# Patient Record
Sex: Male | Born: 1975 | Race: White | Hispanic: No | Marital: Married | Smoking: Never smoker
Health system: Southern US, Community
[De-identification: ages and names within clinical notes are randomized; demographics above are authoritative.]

## PROBLEM LIST (undated history)

## (undated) HISTORY — PX: PILONIDAL CYST / SINUS EXCISION: SUR543

---

## 2008-10-09 ENCOUNTER — Encounter: Admission: RE | Admit: 2008-10-09 | Discharge: 2008-10-09 | Payer: Self-pay | Admitting: Internal Medicine

## 2015-01-28 HISTORY — PX: APPENDECTOMY: SHX54

## 2019-09-02 DIAGNOSIS — Z23 Encounter for immunization: Secondary | ICD-10-CM | POA: Diagnosis not present

## 2019-09-30 DIAGNOSIS — Z23 Encounter for immunization: Secondary | ICD-10-CM | POA: Diagnosis not present

## 2020-11-03 DIAGNOSIS — I10 Essential (primary) hypertension: Secondary | ICD-10-CM | POA: Diagnosis not present

## 2020-11-17 DIAGNOSIS — I10 Essential (primary) hypertension: Secondary | ICD-10-CM | POA: Diagnosis not present

## 2020-11-22 DIAGNOSIS — I10 Essential (primary) hypertension: Secondary | ICD-10-CM | POA: Diagnosis not present

## 2020-12-18 DIAGNOSIS — W19XXXA Unspecified fall, initial encounter: Secondary | ICD-10-CM | POA: Diagnosis not present

## 2020-12-18 DIAGNOSIS — N289 Disorder of kidney and ureter, unspecified: Secondary | ICD-10-CM | POA: Diagnosis not present

## 2020-12-18 DIAGNOSIS — M545 Low back pain, unspecified: Secondary | ICD-10-CM | POA: Diagnosis not present

## 2020-12-18 DIAGNOSIS — M549 Dorsalgia, unspecified: Secondary | ICD-10-CM | POA: Diagnosis not present

## 2020-12-18 DIAGNOSIS — S22080A Wedge compression fracture of T11-T12 vertebra, initial encounter for closed fracture: Secondary | ICD-10-CM | POA: Diagnosis not present

## 2020-12-18 DIAGNOSIS — R55 Syncope and collapse: Secondary | ICD-10-CM | POA: Diagnosis not present

## 2020-12-18 DIAGNOSIS — R42 Dizziness and giddiness: Secondary | ICD-10-CM | POA: Diagnosis not present

## 2020-12-19 ENCOUNTER — Other Ambulatory Visit: Payer: Self-pay

## 2020-12-19 ENCOUNTER — Emergency Department (HOSPITAL_COMMUNITY): Payer: Medicaid Other

## 2020-12-19 ENCOUNTER — Emergency Department (HOSPITAL_COMMUNITY)
Admission: EM | Admit: 2020-12-19 | Discharge: 2020-12-19 | Disposition: A | Discharge status: Home / Self Care | Payer: Medicaid Other | Attending: Emergency Medicine | Admitting: Emergency Medicine

## 2020-12-19 DIAGNOSIS — N289 Disorder of kidney and ureter, unspecified: Secondary | ICD-10-CM | POA: Diagnosis not present

## 2020-12-19 DIAGNOSIS — S22089A Unspecified fracture of T11-T12 vertebra, initial encounter for closed fracture: Secondary | ICD-10-CM | POA: Insufficient documentation

## 2020-12-19 DIAGNOSIS — S22000A Wedge compression fracture of unspecified thoracic vertebra, initial encounter for closed fracture: Secondary | ICD-10-CM | POA: Diagnosis not present

## 2020-12-19 DIAGNOSIS — M47816 Spondylosis without myelopathy or radiculopathy, lumbar region: Secondary | ICD-10-CM | POA: Diagnosis not present

## 2020-12-19 DIAGNOSIS — W010XXA Fall on same level from slipping, tripping and stumbling without subsequent striking against object, initial encounter: Secondary | ICD-10-CM | POA: Diagnosis not present

## 2020-12-19 DIAGNOSIS — I10 Essential (primary) hypertension: Secondary | ICD-10-CM | POA: Insufficient documentation

## 2020-12-19 DIAGNOSIS — R55 Syncope and collapse: Secondary | ICD-10-CM | POA: Diagnosis not present

## 2020-12-19 DIAGNOSIS — M545 Low back pain, unspecified: Secondary | ICD-10-CM | POA: Diagnosis not present

## 2020-12-19 DIAGNOSIS — Y92002 Bathroom of unspecified non-institutional (private) residence single-family (private) house as the place of occurrence of the external cause: Secondary | ICD-10-CM | POA: Diagnosis not present

## 2020-12-19 DIAGNOSIS — S22080A Wedge compression fracture of T11-T12 vertebra, initial encounter for closed fracture: Secondary | ICD-10-CM | POA: Diagnosis not present

## 2020-12-19 DIAGNOSIS — S3992XA Unspecified injury of lower back, initial encounter: Secondary | ICD-10-CM | POA: Diagnosis present

## 2020-12-19 DIAGNOSIS — R000 Tachycardia, unspecified: Secondary | ICD-10-CM | POA: Diagnosis not present

## 2020-12-19 LAB — CBC WITH DIFFERENTIAL/PLATELET
Abs Immature Granulocytes: 0.01 10*3/uL (ref 0.00–0.07)
Basophils Absolute: 0 10*3/uL (ref 0.0–0.1)
Basophils Relative: 0 %
Eosinophils Absolute: 0 10*3/uL (ref 0.0–0.5)
Eosinophils Relative: 0 %
HCT: 47.4 % (ref 39.0–52.0)
Hemoglobin: 15.5 g/dL (ref 13.0–17.0)
Immature Granulocytes: 0 %
Lymphocytes Relative: 18 %
Lymphs Abs: 1.2 10*3/uL (ref 0.7–4.0)
MCH: 27.5 pg (ref 26.0–34.0)
MCHC: 32.7 g/dL (ref 30.0–36.0)
MCV: 84 fL (ref 80.0–100.0)
Monocytes Absolute: 0.8 10*3/uL (ref 0.1–1.0)
Monocytes Relative: 11 %
Neutro Abs: 4.8 10*3/uL (ref 1.7–7.7)
Neutrophils Relative %: 71 %
Platelets: 247 10*3/uL (ref 150–400)
RBC: 5.64 MIL/uL (ref 4.22–5.81)
RDW: 13.9 % (ref 11.5–15.5)
WBC: 6.8 10*3/uL (ref 4.0–10.5)
nRBC: 0 % (ref 0.0–0.2)

## 2020-12-19 LAB — COMPREHENSIVE METABOLIC PANEL
ALT: 76 U/L — ABNORMAL HIGH (ref 0–44)
AST: 51 U/L — ABNORMAL HIGH (ref 15–41)
Albumin: 4 g/dL (ref 3.5–5.0)
Alkaline Phosphatase: 38 U/L (ref 38–126)
Anion gap: 10 (ref 5–15)
BUN: 25 mg/dL — ABNORMAL HIGH (ref 6–20)
CO2: 26 mmol/L (ref 22–32)
Calcium: 8.9 mg/dL (ref 8.9–10.3)
Chloride: 101 mmol/L (ref 98–111)
Creatinine, Ser: 1.23 mg/dL (ref 0.61–1.24)
GFR, Estimated: >60 mL/min (ref >60)
Glucose, Bld: 123 mg/dL — ABNORMAL HIGH (ref 70–99)
Potassium: 3.8 mmol/L (ref 3.5–5.1)
Sodium: 137 mmol/L (ref 135–145)
Total Bilirubin: 1.4 mg/dL — ABNORMAL HIGH (ref 0.3–1.2)
Total Protein: 7.2 g/dL (ref 6.5–8.1)

## 2020-12-19 MED ORDER — METHOCARBAMOL 1000 MG/10ML IJ SOLN
1000.0000 mg | Freq: Once | INTRAMUSCULAR | Status: AC
  Administered 2020-12-19: 1000 mg via INTRAMUSCULAR
  Filled 2020-12-19: qty 10

## 2020-12-19 MED ORDER — OXYCODONE-ACETAMINOPHEN 5-325 MG PO TABS
2.0000 | ORAL_TABLET | Freq: Once | ORAL | Status: AC
  Administered 2020-12-19: 2 via ORAL
  Filled 2020-12-19: qty 2

## 2020-12-19 MED ORDER — NAPROXEN 500 MG PO TABS: 500.0000 mg | ORAL_TABLET | Freq: Two times a day (BID) | ORAL | 0 refills | Status: DC

## 2020-12-19 MED ORDER — DEXAMETHASONE SODIUM PHOSPHATE 10 MG/ML IJ SOLN
10.0000 mg | Freq: Once | INTRAMUSCULAR | Status: AC
  Administered 2020-12-19: 10 mg via INTRAMUSCULAR
  Filled 2020-12-19: qty 1

## 2020-12-19 MED ORDER — OXYCODONE-ACETAMINOPHEN 5-325 MG PO TABS: 1.0000 | ORAL_TABLET | Freq: Four times a day (QID) | ORAL | 0 refills | Status: DC | PRN

## 2020-12-19 MED ORDER — KETOROLAC TROMETHAMINE 60 MG/2ML IM SOLN
30.0000 mg | Freq: Once | INTRAMUSCULAR | Status: AC
  Administered 2020-12-19: 30 mg via INTRAMUSCULAR
  Filled 2020-12-19: qty 2

## 2020-12-19 MED ORDER — METHOCARBAMOL 500 MG PO TABS: 500.0000 mg | ORAL_TABLET | Freq: Two times a day (BID) | ORAL | 0 refills | Status: DC | PRN

## 2020-12-19 MED ORDER — METHYLPREDNISOLONE 4 MG PO TBPK: ORAL_TABLET | ORAL | 0 refills | Status: DC

## 2020-12-19 NOTE — ED Provider Notes (Addendum)
[facility] EMERGENCY DEPARTMENT Provider Note   CSN: [id number] Arrival date & time: 12/19/20  1208     History Chief Complaint  Patient presents with   Back Pain    Joshua Hines is a 45 y.o. male.   Back Pain  This patient is a very pleasant 45 year old male with a history of hypertension, states that he takes no other significant medications.  He presents a short time after having a fall which occurred multiple days ago, he had a syncopal event in the bathroom at night when he went to use the bathroom and landed awkwardly on his back suffering a T12 compression fracture which was identified at an outside hospital.  He was placed on oxycodone and given a back brace and referred to local orthopedics but has not yet seen them and has now run out of pain medication.  He is having increasing pain with any movement of his back whatsoever.  He feels like the pain is radiating from the central area out up into his shoulders and down into his lower back, it makes it almost impossible for him to move around or walk.  No urinary symptoms no fevers no coughing or shortness of breath.  Sometimes he feels like he has pain when he tries to take a deep breath.  He was not having any of those symptoms prior to the fall.  No past medical history on file.  There are no problems to display for this patient.        No family history on file.     Home Medications Prior to Admission medications   Medication Sig Start Date End Date Taking? Authorizing Provider  methocarbamol (ROBAXIN) 500 MG tablet Take 1 tablet (500 mg total) by mouth 2 (two) times daily as needed for muscle spasms. 12/19/20  Yes Eber Hong, MD  methylPREDNISolone (MEDROL DOSEPAK) 4 MG TBPK tablet Taper over 6 days 12/19/20  Yes Eber Hong, MD  naproxen (NAPROSYN) 500 MG tablet Take 1 tablet (500 mg total) by mouth 2 (two) times daily with a meal. 12/19/20  Yes Eber Hong, MD  oxyCODONE-acetaminophen  (PERCOCET) 5-325 MG tablet Take 1 tablet by mouth every 6 (six) hours as needed. 12/19/20  Yes Eber Hong, MD    Allergies    Patient has no allergy information on record.  Review of Systems   Review of Systems  Musculoskeletal:  Positive for back pain.  All other systems reviewed and are negative.  Physical Exam Updated Vital Signs BP (!) 112/56   Pulse 89   Temp 98.9 F (37.2 C)   Resp 16   SpO2 98%   Physical Exam Vitals and nursing note reviewed.  Constitutional:      General: He is not in acute distress.    Appearance: He is well-developed.  HENT:     Head: Normocephalic and atraumatic.     Mouth/Throat:     Pharynx: No oropharyngeal exudate.  Eyes:     General: No scleral icterus.       Right eye: No discharge.        Left eye: No discharge.     Conjunctiva/sclera: Conjunctivae normal.     Pupils: Pupils are equal, round, and reactive to light.  Neck:     Thyroid: No thyromegaly.     Vascular: No JVD.  Cardiovascular:     Rate and Rhythm: Normal rate and regular rhythm.     Heart sounds: Normal heart sounds. No murmur heard.  No friction rub. No gallop.     Comments: The patient has a pulse of 90 on my exam Pulmonary:     Effort: Pulmonary effort is normal. No respiratory distress.     Breath sounds: Normal breath sounds. No wheezing or rales.  Abdominal:     General: Bowel sounds are normal. There is no distension.     Palpations: Abdomen is soft. There is no mass.     Tenderness: There is no abdominal tenderness.  Musculoskeletal:        General: Tenderness present. Normal range of motion.     Cervical back: Normal range of motion and neck supple.     Comments: Some tenderness over the back at the location of the mid and lower thoracic spine  Lymphadenopathy:     Cervical: No cervical adenopathy.  Skin:    General: Skin is warm and dry.     Findings: No erythema or rash.  Neurological:     Mental Status: He is alert.     Coordination:  Coordination normal.     Comments: This patient is awake alert and able to follow all of my commands, he can straight leg raise bilaterally, this does cause some back pain.  He is able to move both arms.  Cranial nerves III through XII are  Psychiatric:        Behavior: Behavior normal.    ED Results / Procedures / Treatments   Labs (all labs ordered are listed, but only abnormal results are displayed) Labs Reviewed  COMPREHENSIVE METABOLIC PANEL - Abnormal; Notable for the following components:      Result Value   Glucose, Bld 123 (*)    BUN 25 (*)    AST 51 (*)    ALT 76 (*)    Total Bilirubin 1.4 (*)    All other components within normal limits  CBC WITH DIFFERENTIAL/PLATELET  URINALYSIS, ROUTINE W REFLEX MICROSCOPIC  CBG MONITORING, ED    EKG EKG Interpretation  Date/Time:  Sunday December 19 2020 15:17:19 EDT Ventricular Rate:  110 PR Interval:  144 QRS Duration: 82 QT Interval:  324 QTC Calculation: 438 R Axis:   34 Text Interpretation: Sinus tachycardia Minimal voltage criteria for LVH, may be normal variant ( R in aVL ) Nonspecific ST abnormality Abnormal ECG Confirmed by Eber Hong ([id number]) on 12/19/2020 4:29:13 PM  Radiology CT Cervical Spine Wo Contrast  Result Date: 12/19/2020 CLINICAL DATA:  Neck trauma, complicated. EXAM: CT CERVICAL SPINE WITHOUT CONTRAST TECHNIQUE: Multidetector CT imaging of the cervical spine was performed without intravenous contrast. Multiplanar CT image reconstructions were also generated. COMPARISON:  None. FINDINGS: Alignment: Normal Skull base and vertebrae: No fracture or focal bone lesion. Soft tissues and spinal canal: Normal Disc levels: Normal. No degenerative changes. No canal or foraminal stenosis. No facet arthropathy. Upper chest: Mild atelectasis or scarring at the right apex. Otherwise negative. Other: None IMPRESSION: Normal cervical spine CT. Electronically Signed   By: Paulina Fusi M.D.   On: 12/19/2020 15:25   CT  Thoracic Spine Wo Contrast  Result Date: 12/19/2020 CLINICAL DATA:  Spine fracture.  T12 compression. EXAM: CT THORACIC SPINE WITHOUT CONTRAST TECHNIQUE: Multidetector CT images of the thoracic were obtained using the standard protocol without intravenous contrast. COMPARISON:  Lumbar radiography 12/18/2020.  Abdominal CT 02/18/2015 FINDINGS: Alignment: Normal Vertebrae: Superior endplate compression fracture at T12 that is recent. Loss of height of 10% anteriorly. No retropulsed bone. No other regional fracture. Paraspinal and other soft tissues: Negative  Disc levels: No significant disc space pathology. No stenosis of the canal or foramina. IMPRESSION: Superior endplate fracture at T12 with loss of height of 10% which is recent. No retropulsed bone. Electronically Signed   By: Paulina Fusi M.D.   On: 12/19/2020 15:24   CT Lumbar Spine Wo Contrast  Result Date: 12/19/2020 CLINICAL DATA:  Larey Seat 2 days ago.  T12 fracture. EXAM: CT LUMBAR SPINE WITHOUT CONTRAST TECHNIQUE: Multidetector CT imaging of the lumbar spine was performed without intravenous contrast administration. Multiplanar CT image reconstructions were also generated. COMPARISON:  Radiography 12/18/2020 FINDINGS: Segmentation: 5 lumbar type vertebral bodies. Alignment: Normal Vertebrae: No lumbar region fracture. Acute superior endplate fracture N27 described in the thoracic study. Paraspinal and other soft tissues: Normal Disc levels: No abnormality at L2-3 or above. L3-4: Mild facet osteoarthritis.  No stenosis. L4-5: Mild facet osteoarthritis. No stenosis. 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 L5-S1: Chronic disc degeneration with loss of disc height and small endplate osteophytes. No compressive narrowing of the canal or foramina. IMPRESSION: No acute finding in the lumbar region. 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 Chronic disc degeneration at L5-S1 without stenosis. Electronically Signed   By: Paulina Fusi M.D.   On: 12/19/2020 15:27     Procedures Procedures   Medications Ordered in ED Medications  methocarbamol (ROBAXIN) injection 1,000 mg (has no administration in time range)  dexamethasone (DECADRON) injection 10 mg (has no administration in time range)  oxyCODONE-acetaminophen (PERCOCET/ROXICET) 5-325 MG per tablet 2 tablet (has no administration in time range)  ketorolac (TORADOL) injection 30 mg (30 mg Intramuscular Given 12/19/20 1451)    ED Course  I have reviewed the triage vital signs and the nursing notes.  Pertinent labs & imaging results that were available during my care of the patient were reviewed by me and considered in my medical decision making (see chart for details).    MDM Rules/Calculators/A&P                           At this time the patient appears to have an exam consistent with a spinal fracture.  The CT scans confirmed today that he does have about a 10% volume loss on his thoracic compression fracture.  He is already in the splint that he needs, he will be referred to local neurosurgery for spine rehab and consultation.  He will be given some additional pain medications as well as muscle relaxer and a steroid.  He will also be prescribed an anti-inflammatory.  The patient is in total agreement with the plan and happy with the process.  The spouse is in the room, additional history obtained, she reviewed with me the patient's history of the illness the accident and the ER visit from the outside hospital.  They have shared with me the records including the x-ray reports from that visit  I think that this patient would benefit from a walker - has been Rx - cannot ambulate without  Final Clinical Impression(s) / ED Diagnoses Final diagnoses:  Thoracic compression fracture, closed, initial encounter [facility])    Rx / DC Orders ED Discharge Orders          Ordered    naproxen (NAPROSYN) 500 MG tablet  2 times daily with meals        12/19/20 1706    methocarbamol (ROBAXIN) 500 MG tablet   2 times daily PRN        12/19/20 1706  methylPREDNISolone (MEDROL DOSEPAK) 4 MG TBPK tablet        12/19/20 1706    oxyCODONE-acetaminophen (PERCOCET) 5-325 MG tablet  Every 6 hours PRN        12/19/20 1706             Eber Hong, MD 12/19/20 1708    Eber Hong, MD 12/19/20 1710

## 2020-12-19 NOTE — Discharge Instructions (Signed)
VitalsYour testing today confirms that you do have a 10% compression fracture of your thoracic vertebrae.  I want you to see the neurosurgeon\surgeon that I have referred you to above, please call the office and you should be seen within 2 weeks.  They will likely take you weeks if not months for this pain to get better and improve so please be patient with it.  I have given you enough pain medicine to get back to see your family doctor for ongoing pain control and I have also prescribed a muscle relaxer and a steroid course.

## 2020-12-19 NOTE — ED Provider Notes (Signed)
Emergency Medicine Provider Triage Evaluation Note  Joshua Hines , a 45 y.o. male  was evaluated in triage.  Pt complains of back spasms.  Patient reports that early Saturday morning around 0130 he was walking to the bathroom.  He reports when he stood up from the toilet he passed out.  He says he does not remember feeling dizzy, but had a moment of loss of consciousness.  He was evaluated at [facility] and he was told he had a T12 compression fracture and given a back brace and told to follow-up with Ortho.  He reports he did not tell him anything about his syncope work-up.  Denies any numbness, tingling, or weakness of the bilateral lower extremities.  Patient reports he was given oxycodone upon discharge and took 1 last night, but when trying to fall asleep, he was having worsening back spasms.  Review of Systems  Positive: Back pain Negative: Numbness, tingling or weakness, dysuria, hematuria, fecal incontinence, bowel incontinence, urinary retention  Physical Exam  BP 138/90   Pulse (!) 114   Temp 98.9 F (37.2 C)   Resp 16   SpO2 98%  Gen:   Awake, no distress   Resp:  Normal effort  MSK:   Moves extremities without difficulty  Other:    Medical Decision Making  Medically screening exam initiated at 1:52 PM.  Appropriate orders placed.  Joshua Hines was informed that the remainder of the evaluation will be completed by another provider, this initial triage assessment does not replace that evaluation, and the importance of remaining in the ED until their evaluation is complete.  Ordered Toradol for patient's pain. The patient had a plain film XR at [facility]. Will order CT and syncope workup.    Achille Rich, PA-C 12/19/20 1610    Jacalyn Lefevre, MD 12/19/20 [phone]

## 2020-12-19 NOTE — ED Triage Notes (Signed)
Pt states he had a syncopal episode 2 days ago and fell on his back.  Went to [facility] ED yesterday and diagnosed with T12 fracture.  Pt wearing back brace.  States he is taking Oxycodone without relief.

## 2021-02-09 DIAGNOSIS — S22080A Wedge compression fracture of T11-T12 vertebra, initial encounter for closed fracture: Secondary | ICD-10-CM | POA: Diagnosis not present

## 2021-02-25 DIAGNOSIS — Z13820 Encounter for screening for osteoporosis: Secondary | ICD-10-CM | POA: Diagnosis not present

## 2021-07-20 DIAGNOSIS — Z125 Encounter for screening for malignant neoplasm of prostate: Secondary | ICD-10-CM | POA: Diagnosis not present

## 2021-07-20 DIAGNOSIS — R7301 Impaired fasting glucose: Secondary | ICD-10-CM | POA: Diagnosis not present

## 2021-07-20 DIAGNOSIS — I10 Essential (primary) hypertension: Secondary | ICD-10-CM | POA: Diagnosis not present

## 2021-07-20 DIAGNOSIS — R5383 Other fatigue: Secondary | ICD-10-CM | POA: Diagnosis not present

## 2021-07-20 DIAGNOSIS — E7800 Pure hypercholesterolemia, unspecified: Secondary | ICD-10-CM | POA: Diagnosis not present

## 2021-08-03 DIAGNOSIS — I10 Essential (primary) hypertension: Secondary | ICD-10-CM | POA: Diagnosis not present

## 2021-08-03 DIAGNOSIS — Z0000 Encounter for general adult medical examination without abnormal findings: Secondary | ICD-10-CM | POA: Diagnosis not present

## 2021-08-03 DIAGNOSIS — E7800 Pure hypercholesterolemia, unspecified: Secondary | ICD-10-CM | POA: Diagnosis not present

## 2021-08-17 DIAGNOSIS — I10 Essential (primary) hypertension: Secondary | ICD-10-CM | POA: Diagnosis not present

## 2021-08-17 DIAGNOSIS — R002 Palpitations: Secondary | ICD-10-CM | POA: Diagnosis not present

## 2021-08-25 DIAGNOSIS — R002 Palpitations: Secondary | ICD-10-CM | POA: Diagnosis not present

## 2021-08-25 DIAGNOSIS — I10 Essential (primary) hypertension: Secondary | ICD-10-CM | POA: Diagnosis not present

## 2021-09-16 DIAGNOSIS — Z1211 Encounter for screening for malignant neoplasm of colon: Secondary | ICD-10-CM | POA: Diagnosis not present

## 2021-09-16 DIAGNOSIS — I10 Essential (primary) hypertension: Secondary | ICD-10-CM | POA: Diagnosis not present

## 2022-03-22 DIAGNOSIS — Z1211 Encounter for screening for malignant neoplasm of colon: Secondary | ICD-10-CM | POA: Diagnosis not present

## 2022-03-22 DIAGNOSIS — K648 Other hemorrhoids: Secondary | ICD-10-CM | POA: Diagnosis not present

## 2022-03-22 DIAGNOSIS — D124 Benign neoplasm of descending colon: Secondary | ICD-10-CM | POA: Diagnosis not present

## 2022-06-12 ENCOUNTER — Telehealth: Payer: Self-pay | Admitting: Internal Medicine

## 2022-06-12 NOTE — Telephone Encounter (Signed)
Attempted to contact patient to schedule apt with PCP. No working #

## 2022-06-26 IMAGING — CT CT T SPINE W/O CM
3 of 4 series · 11 of 33 positions shown, 13 images · non-contrast
Comparison: Lumbar radiography 12/18/2020.  Abdominal CT 02/18/2015

CLINICAL DATA: Spine fracture.  T12 compression.

EXAM:
CT THORACIC SPINE WITHOUT CONTRAST
TECHNIQUE: Multidetector CT images of the thoracic were obtained using the
standard protocol without intravenous contrast.

[Series 7: sag bone · sagittal · 0.39mm/px · 5 of 80 slices shown, 6 images]
[im 27/80  bone]
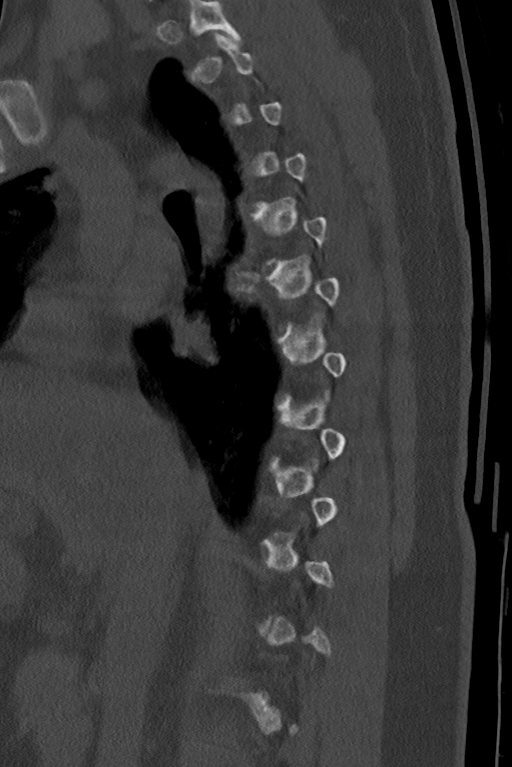
[im 33/80  bone]
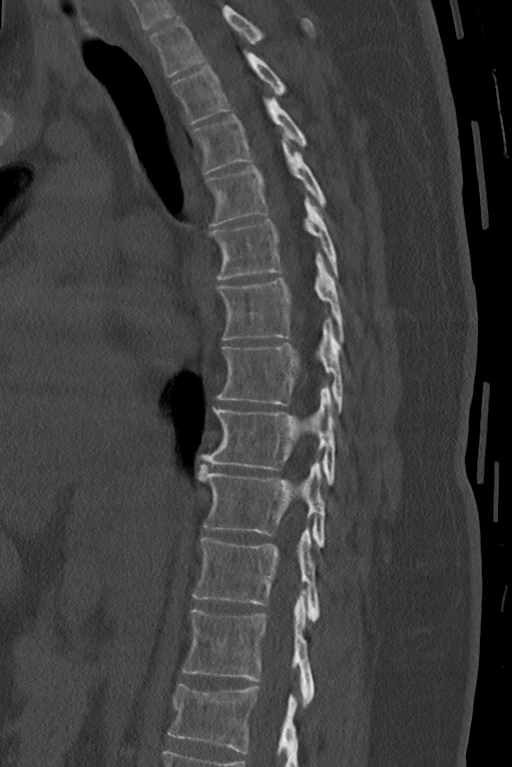
[im 40/80  soft-tissue]
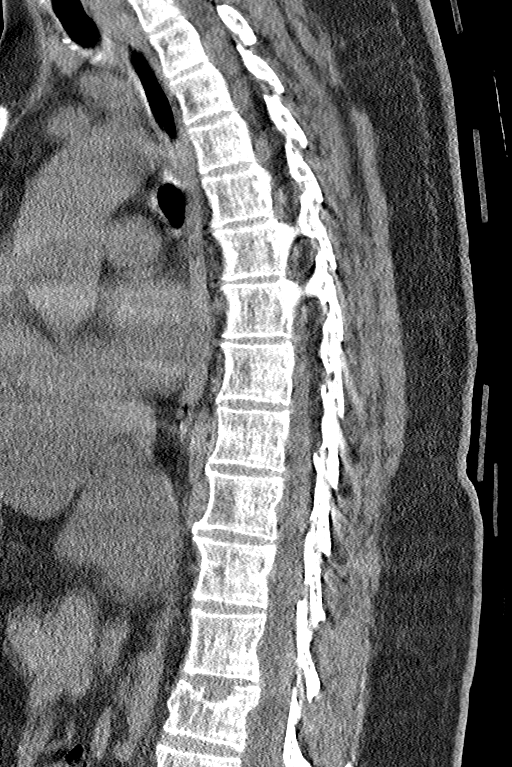
[im 40/80  bone]
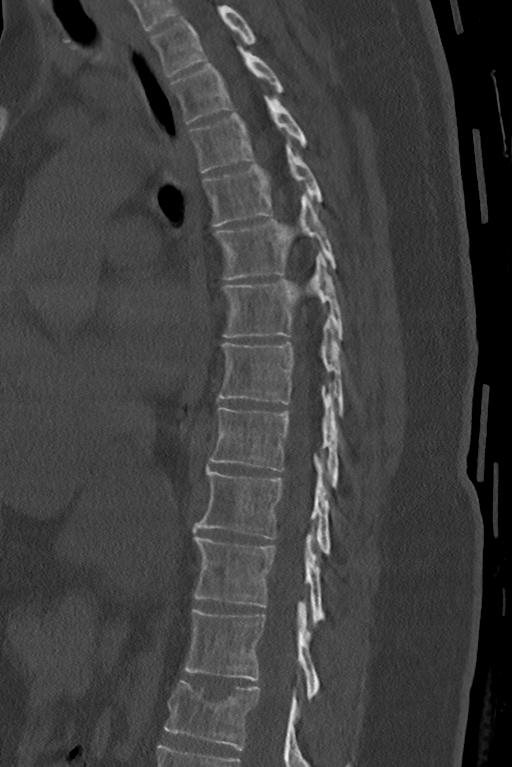
[im 47/80  bone]
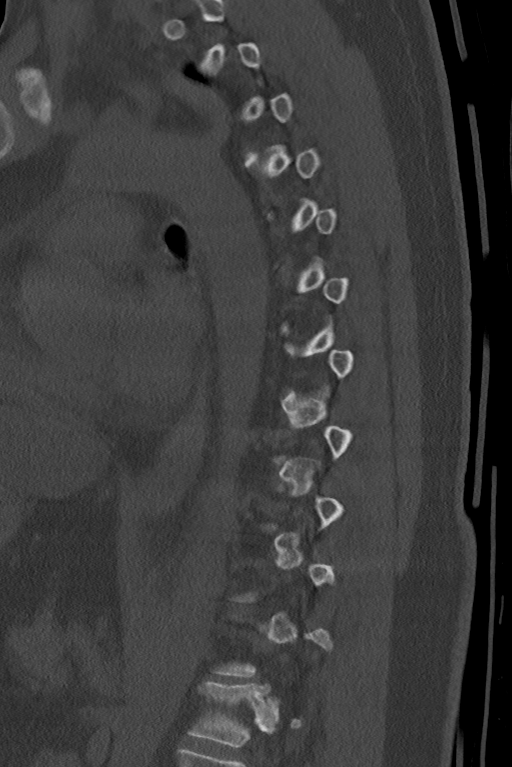
[im 53/80  bone]
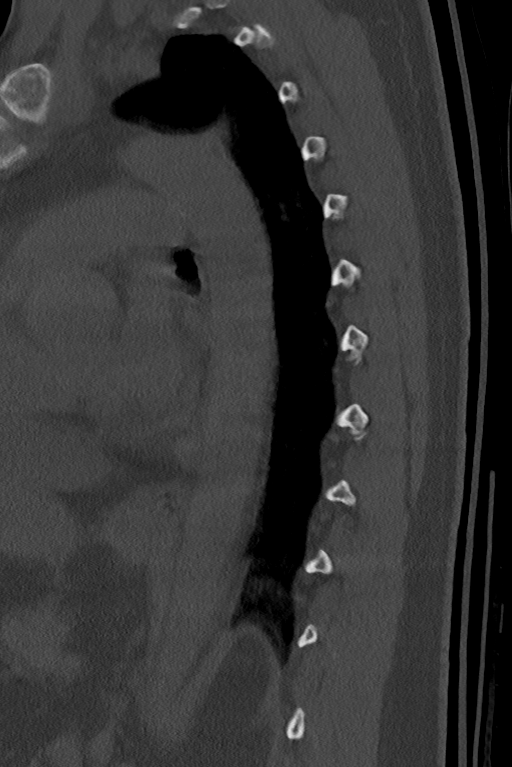

[Series 8: cor bone · coronal · 0.26mm/px · 3 of 88 slices shown]
[im 18/88  bone]
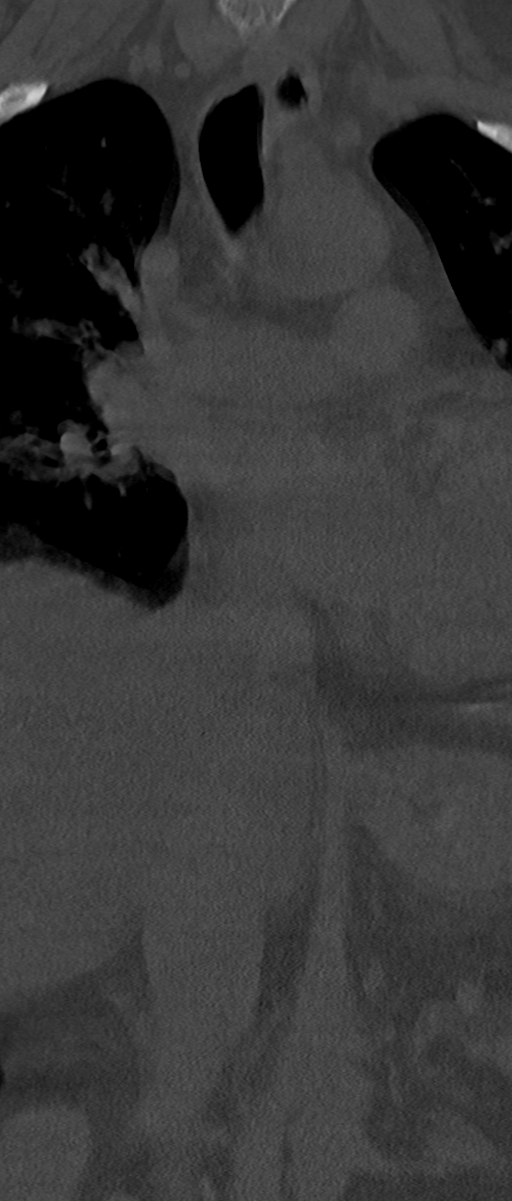
[im 35/88  bone]
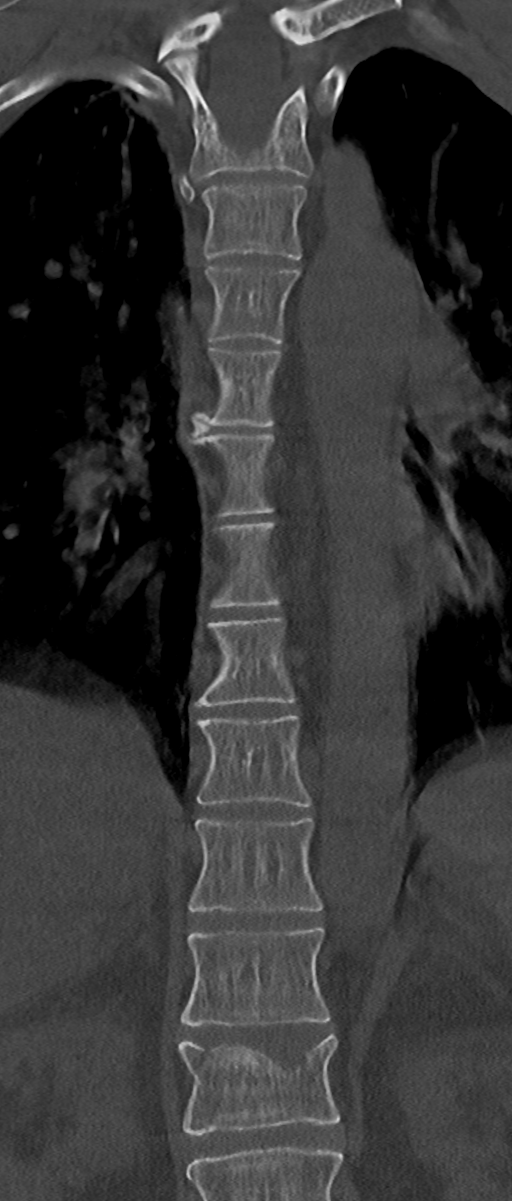
[im 53/88  bone]
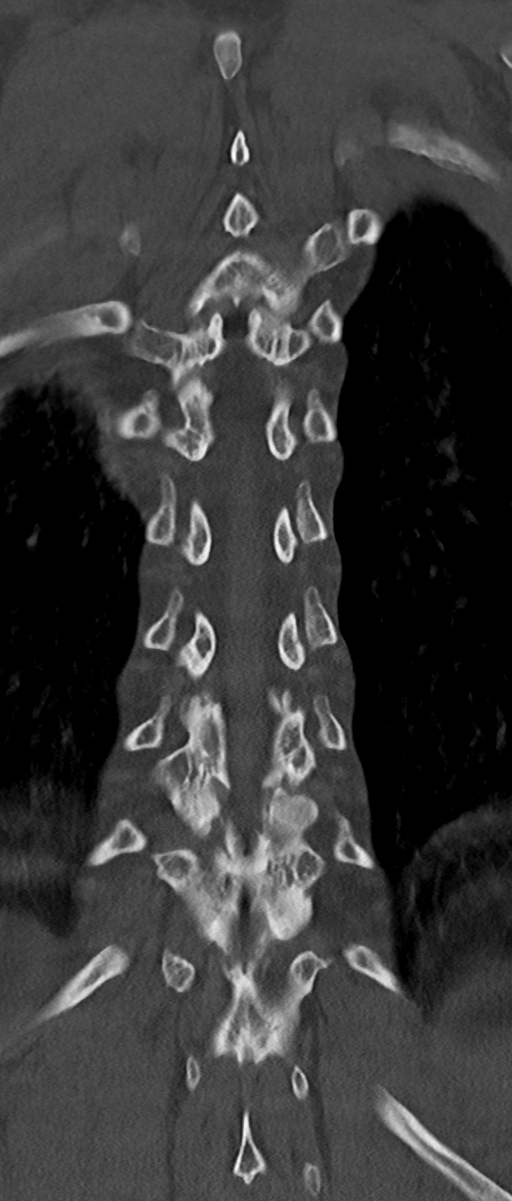

[Series 10: orthogonal bone · axial · 0.21mm/px · z∈[-651,-444]mm · 3 of 172 slices shown, 4 images]
[im 29/172  soft-tissue]
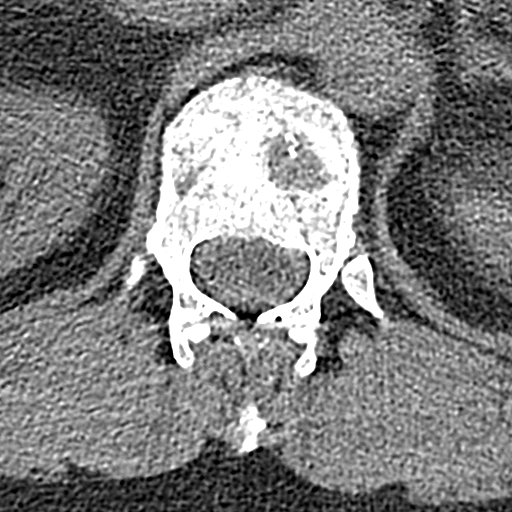
[im 29/172  bone]
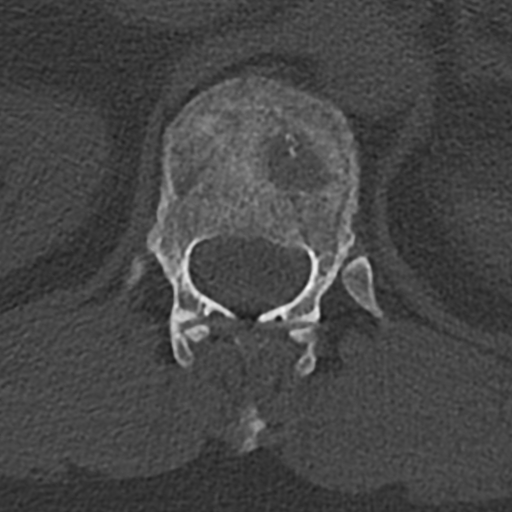
[im 86/172  bone]
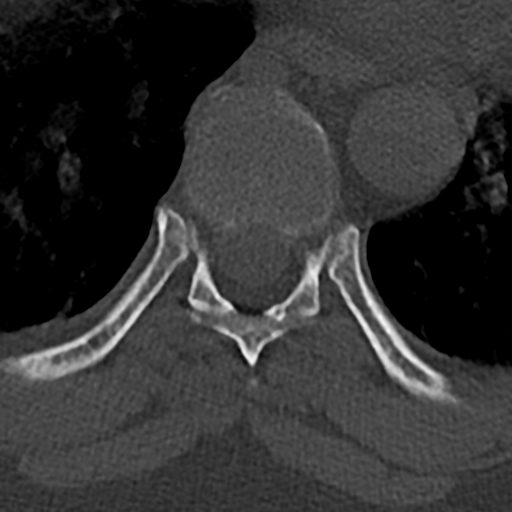
[im 143/172  bone]
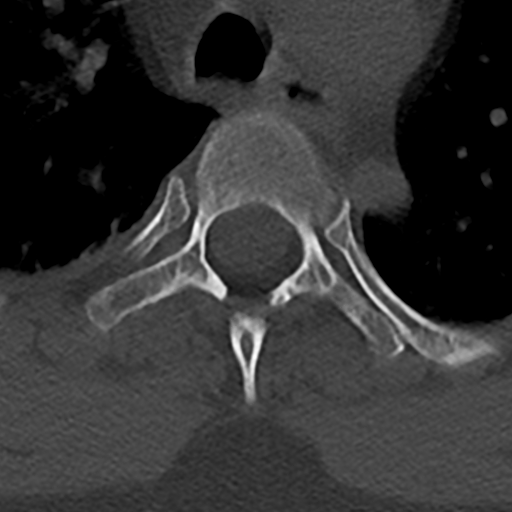

[11 of 33 positions shown; findings below may reference images not displayed]

FINDINGS: Alignment: Normal

Vertebrae: Superior endplate compression fracture at T12 that is
recent. Loss of height of 10% anteriorly. No retropulsed bone. No
other regional fracture.

Paraspinal and other soft tissues: Negative

Disc levels: No significant disc space pathology. No stenosis of the
canal or foramina.
IMPRESSION: Superior endplate fracture at T12 with loss of height of 10% which
is recent. No retropulsed bone.

## 2022-06-26 IMAGING — CT CT CERVICAL SPINE W/O CM
3 of 4 series · 14 of 33 positions shown, 17 images · non-contrast
Comparison: None.

CLINICAL DATA: Neck trauma, complicated.

EXAM:
CT CERVICAL SPINE WITHOUT CONTRAST
TECHNIQUE: Multidetector CT imaging of the cervical spine was performed without
intravenous contrast. Multiplanar CT image reconstructions were also
generated.

[Series 4: orthogonal axials · axial · 0.21mm/px · z∈[-414,-288]mm · 6 of 91 slices shown, 8 images]
[im 13/91  soft-tissue]
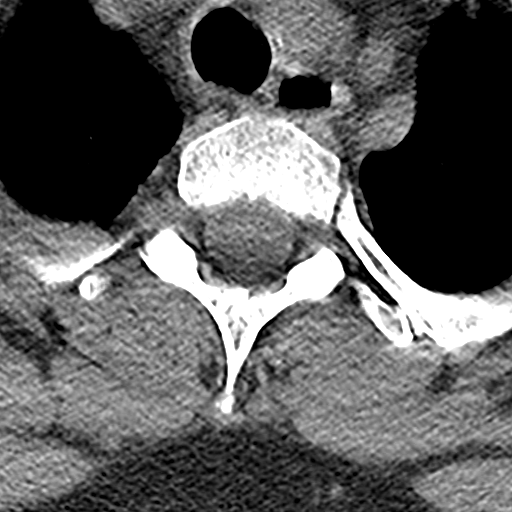
[im 13/91  bone]
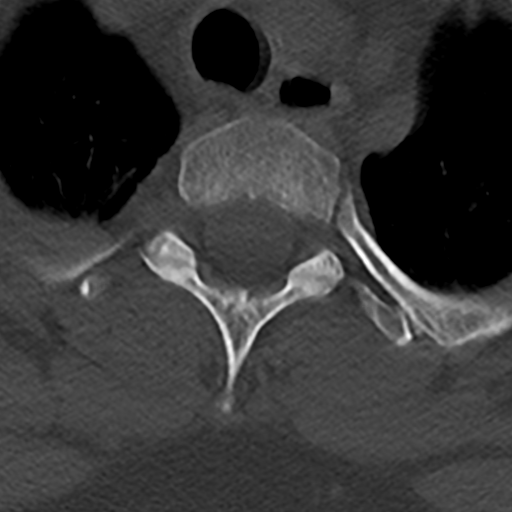
[im 26/91  bone]
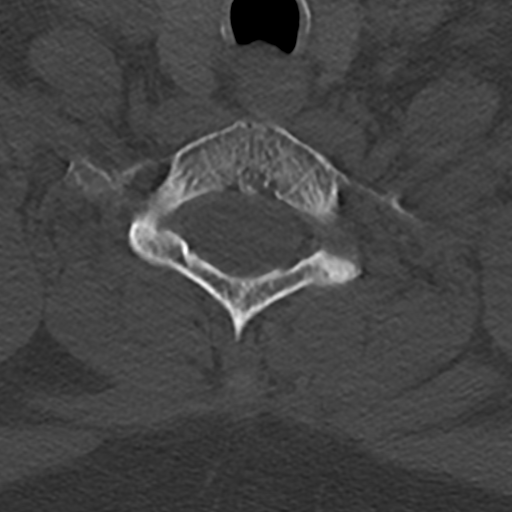
[im 39/91  bone]
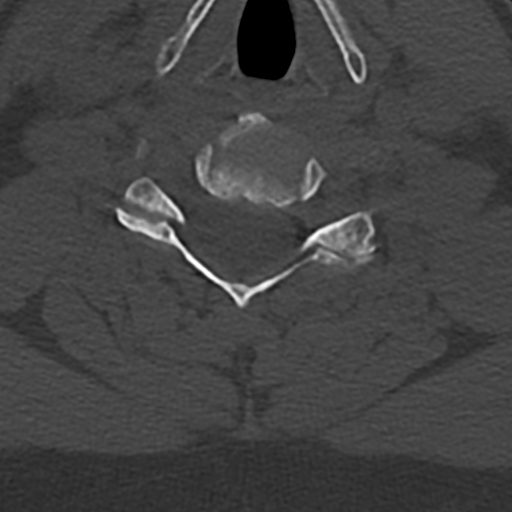
[im 52/91  bone]
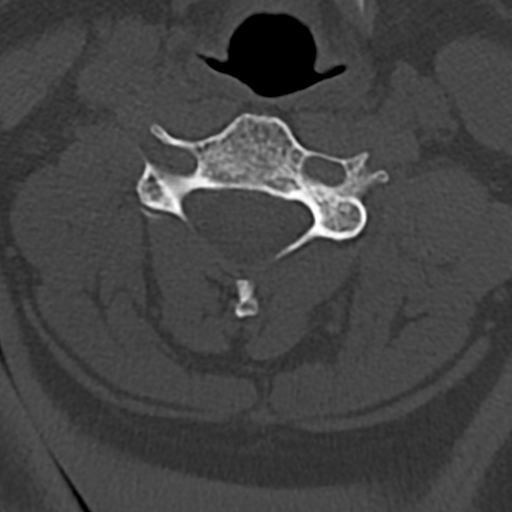
[im 65/91  soft-tissue]
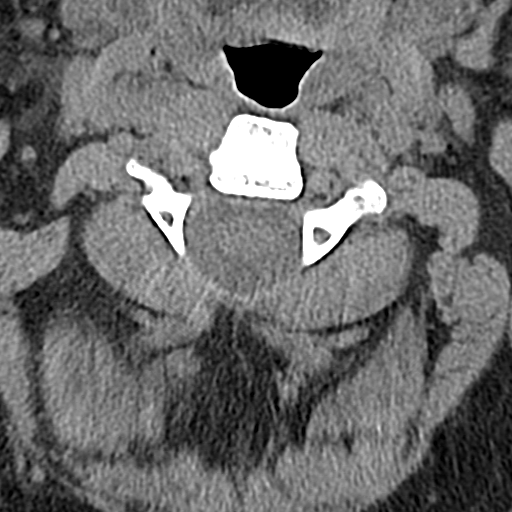
[im 65/91  bone]
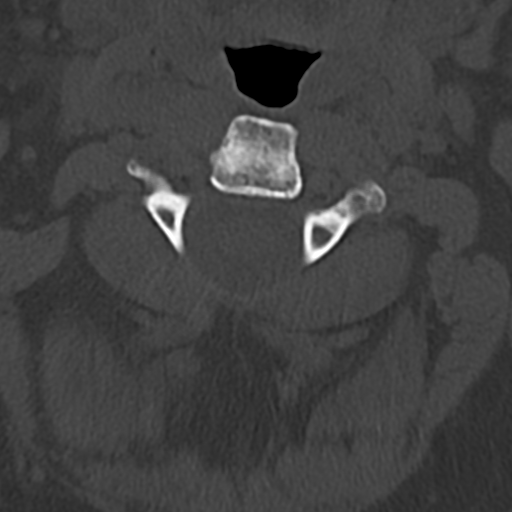
[im 78/91  bone]
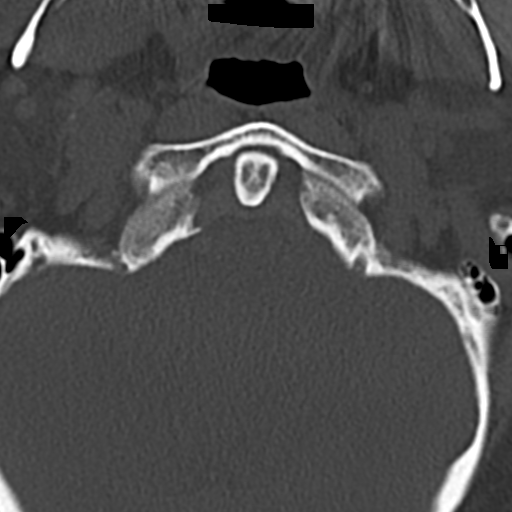

[Series 9: sag bone · sagittal · 0.21mm/px · 5 of 83 slices shown, 6 images]
[im 28/83  bone]
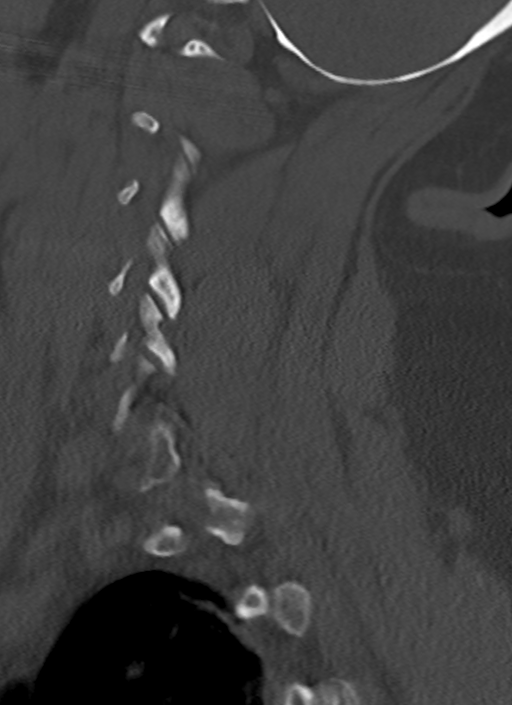
[im 35/83  bone]
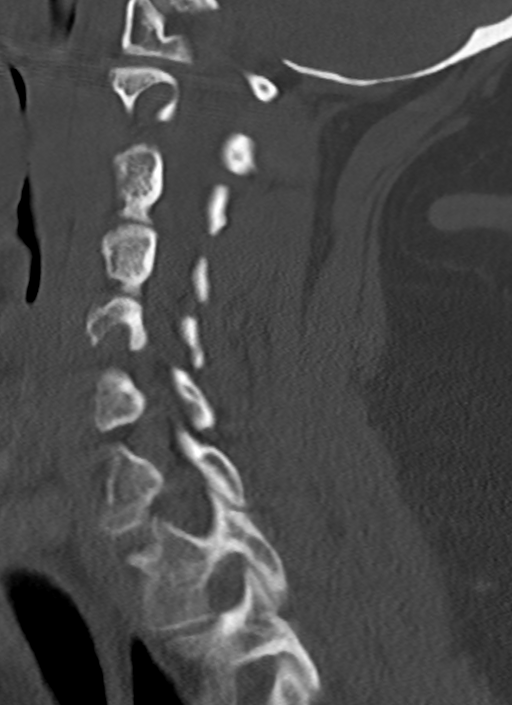
[im 42/83  soft-tissue]
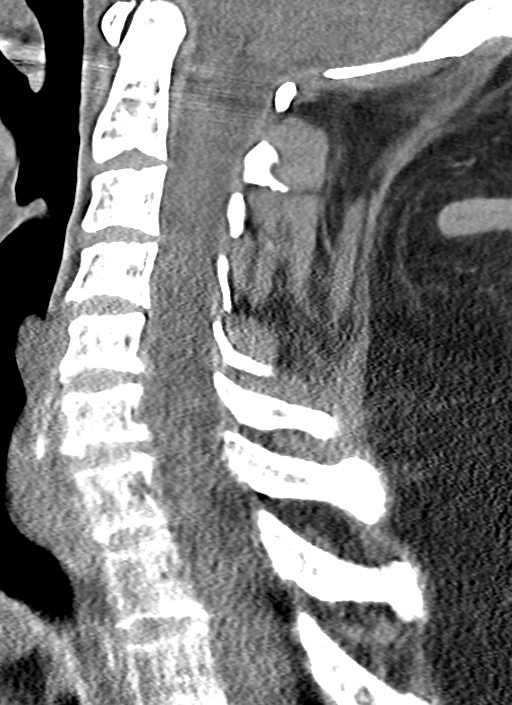
[im 42/83  bone]
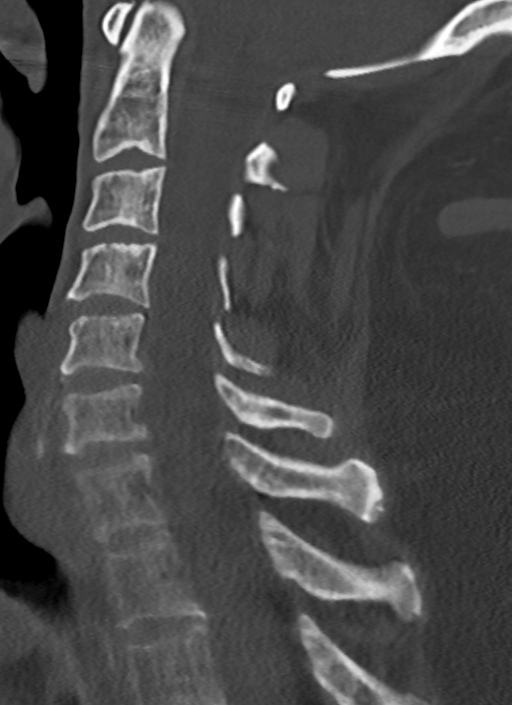
[im 48/83  bone]
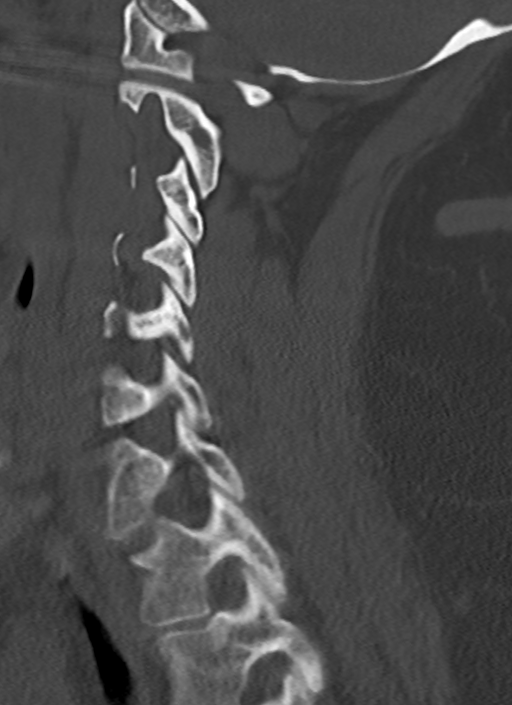
[im 55/83  bone]
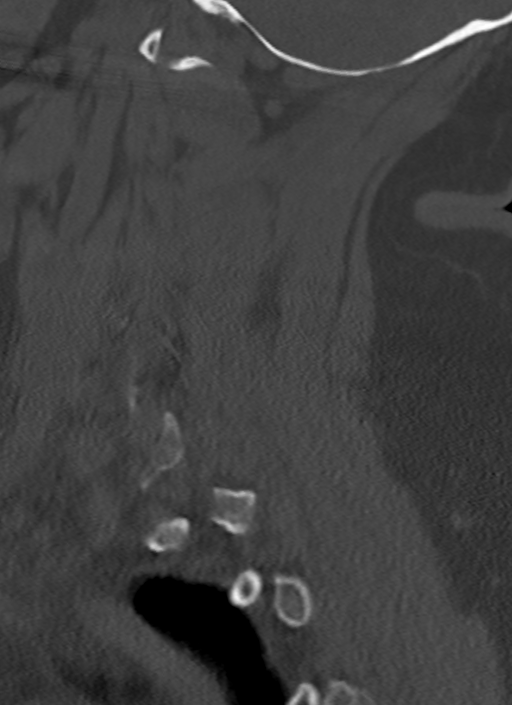

[Series 10: cor bone · coronal · 0.34mm/px · 3 of 76 slices shown]
[im 16/76  bone]
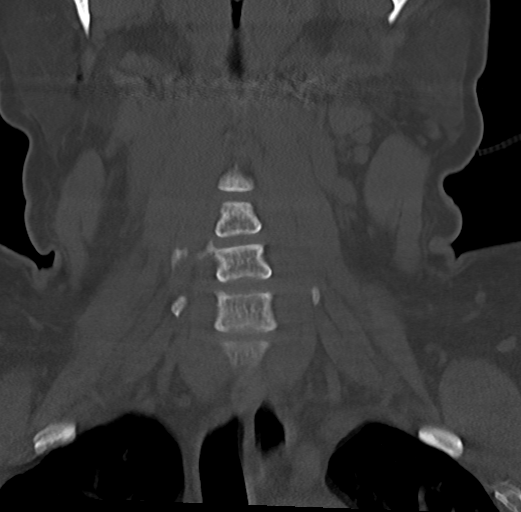
[im 31/76  bone]
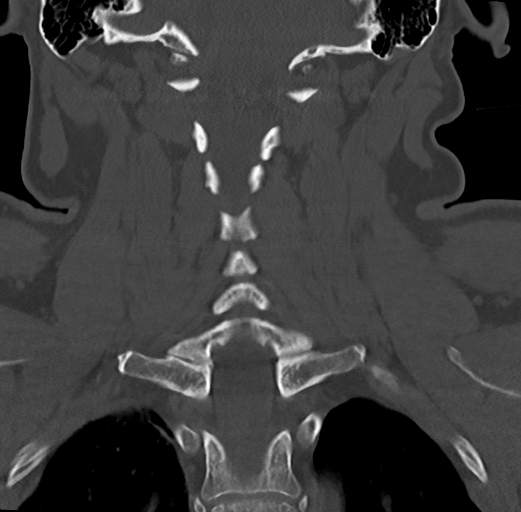
[im 46/76  bone]
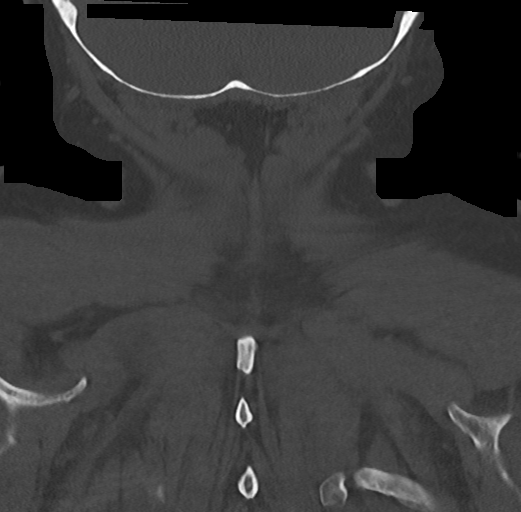

[14 of 33 positions shown; findings below may reference images not displayed]

FINDINGS: Alignment: Normal

Skull base and vertebrae: No fracture or focal bone lesion.

Soft tissues and spinal canal: Normal

Disc levels: Normal. No degenerative changes. No canal or foraminal
stenosis. No facet arthropathy.

Upper chest: Mild atelectasis or scarring at the right apex.
Otherwise negative.

Other: None
IMPRESSION: Normal cervical spine CT.

## 2022-06-26 IMAGING — CT CT L SPINE W/O CM
3 of 5 series · 14 of 33 positions shown, 16 images · non-contrast
Comparison: Radiography 12/18/2020

CLINICAL DATA: Fell 2 days ago.  T12 fracture.

EXAM:
CT LUMBAR SPINE WITHOUT CONTRAST
TECHNIQUE: Multidetector CT imaging of the lumbar spine was performed without
intravenous contrast administration. Multiplanar CT image
reconstructions were also generated.

[Series 6: l spine soft · axial · 0.38mm/px · z∈[-848,-672]mm · 8 of 106 slices shown, 10 images]
[im 9/106  soft-tissue]
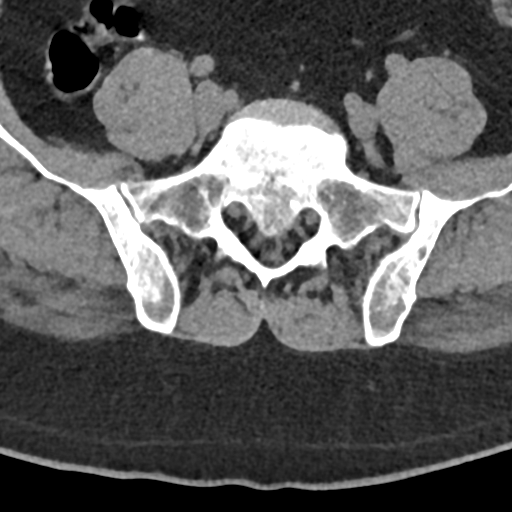
[im 9/106  bone]
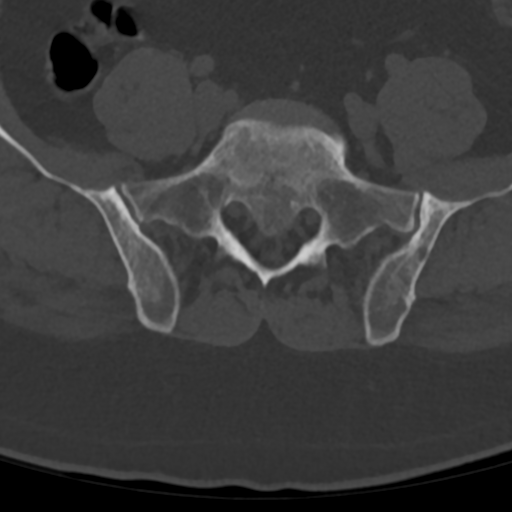
[im 25/106  bone]
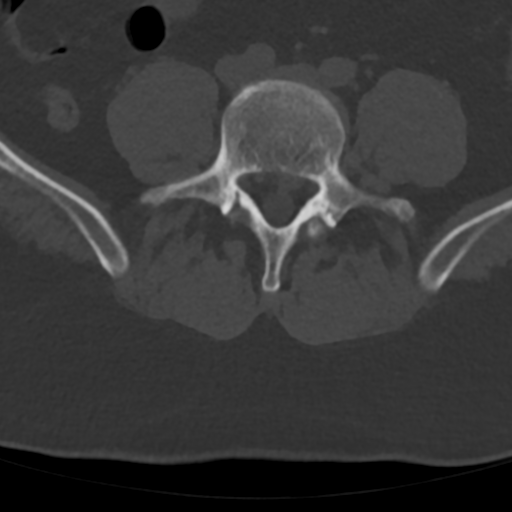
[im 33/106  bone]
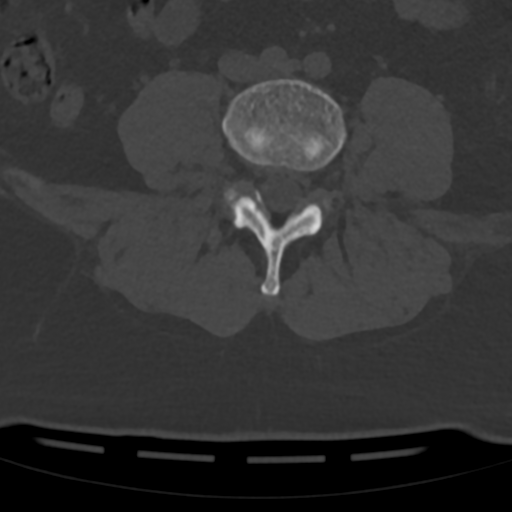
[im 49/106  bone]
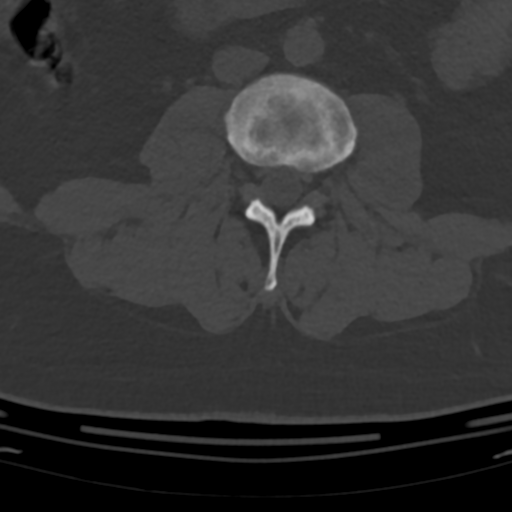
[im 57/106  soft-tissue]
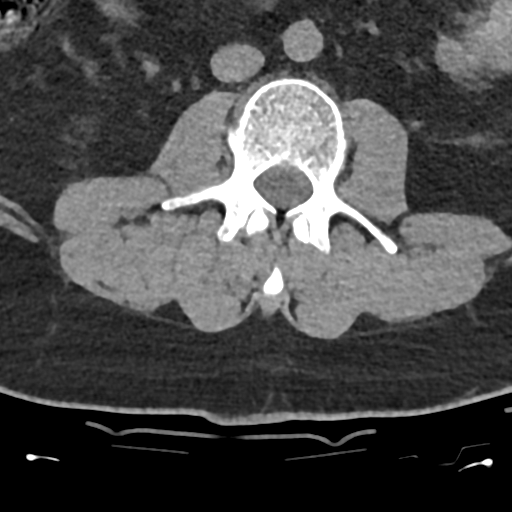
[im 57/106  bone]
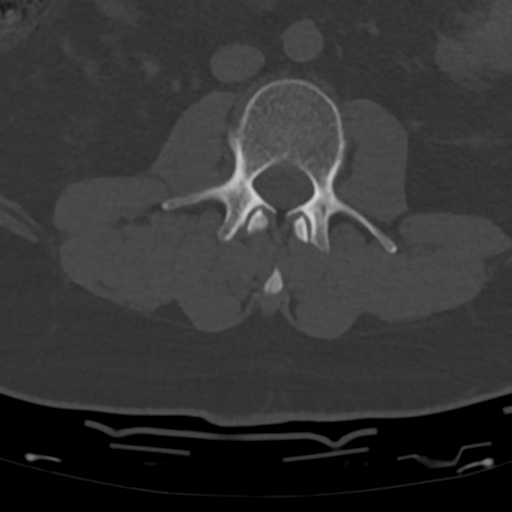
[im 73/106  bone]
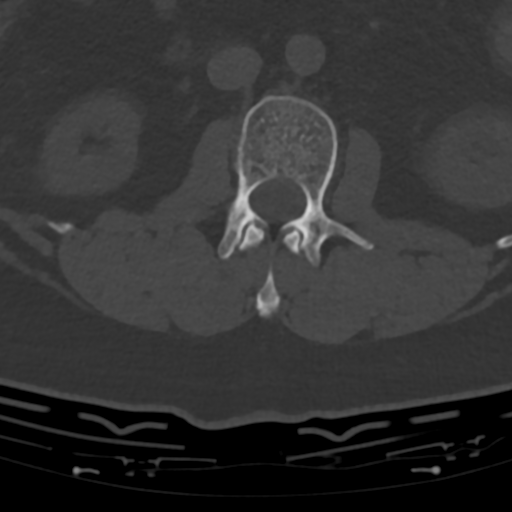
[im 81/106  bone]
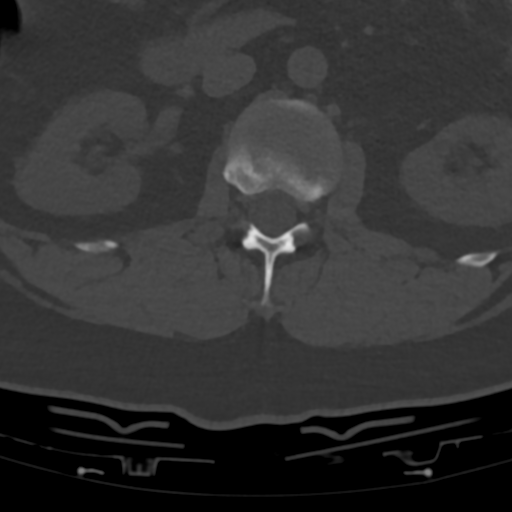
[im 97/106  bone]
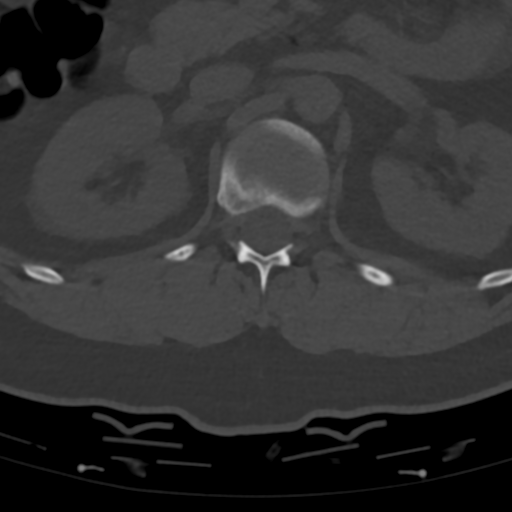

[Series 8: sag bone · sagittal · 0.29mm/px · 5 of 75 slices shown]
[im 13/75  bone]
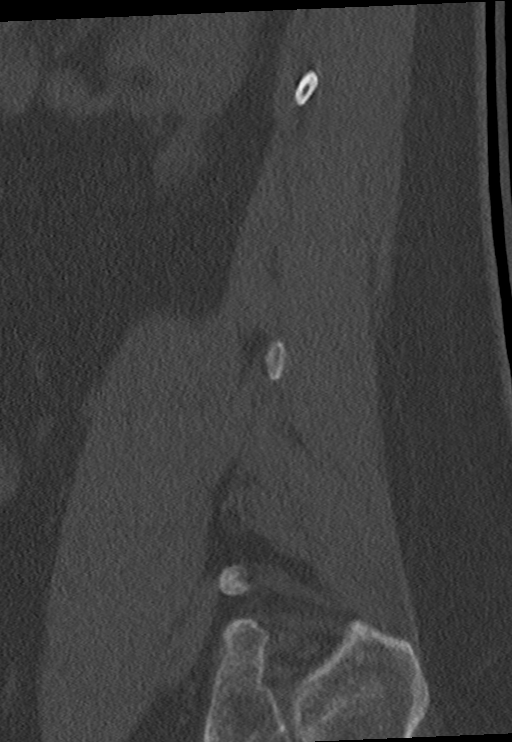
[im 25/75  bone]
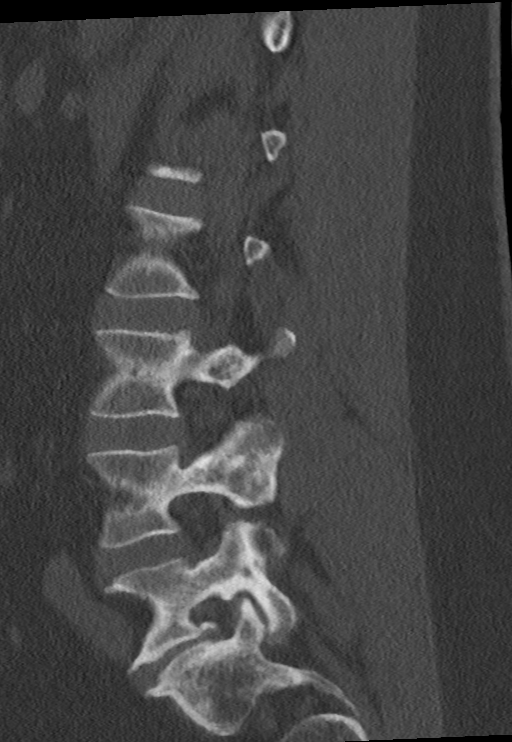
[im 38/75  bone]
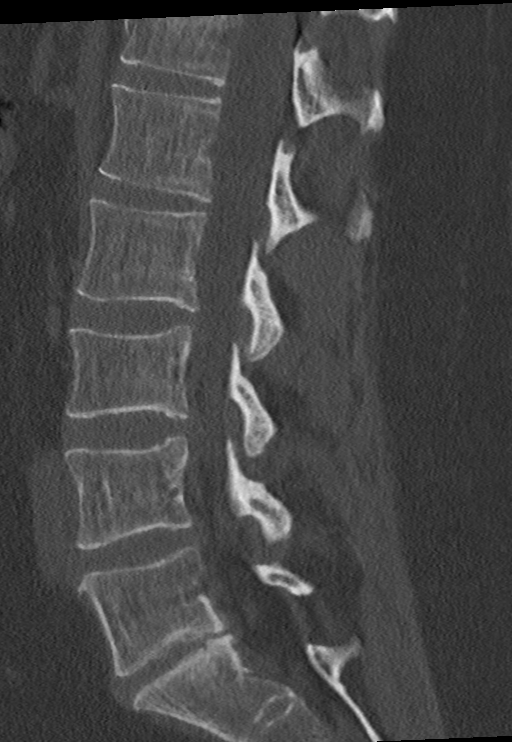
[im 50/75  bone]
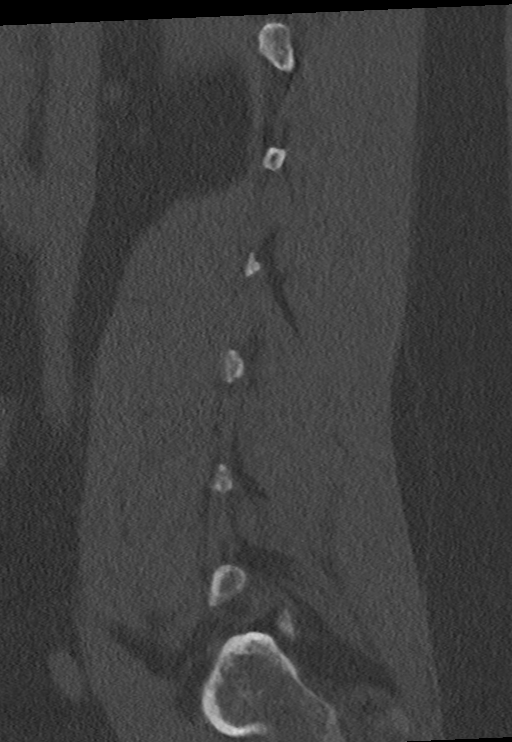
[im 62/75  bone]
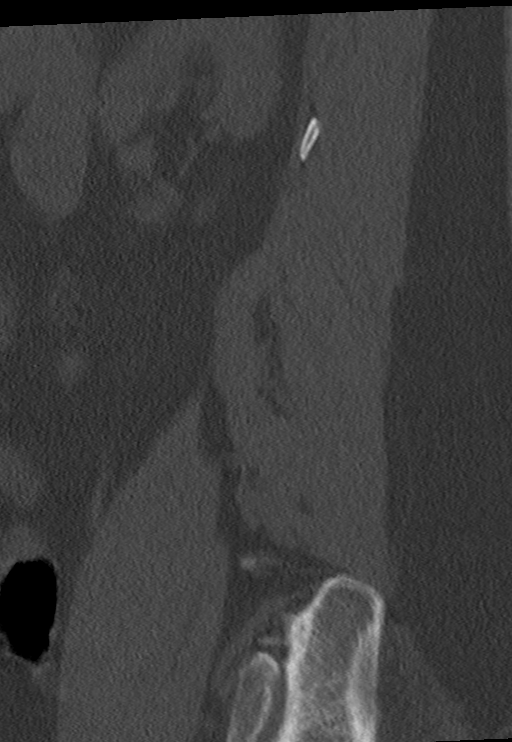

[Series 10: cor bone · coronal · 0.28mm/px · 1 of 81 slices shown]
[im 41/81  bone]
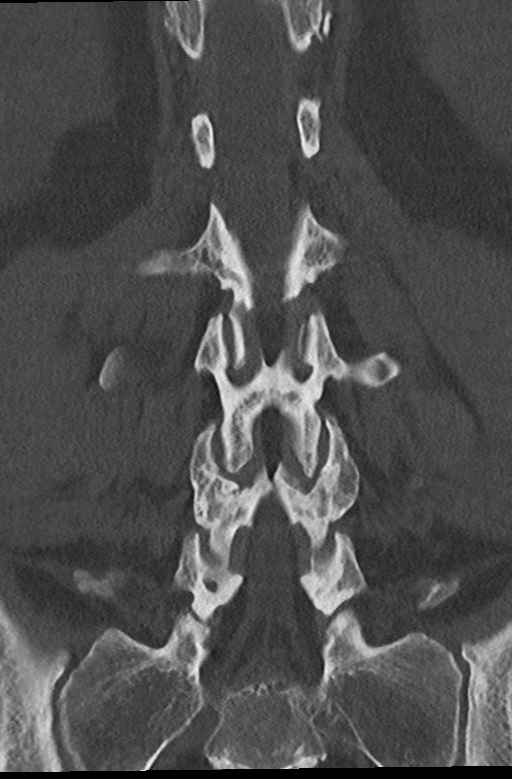

[14 of 33 positions shown; findings below may reference images not displayed]

FINDINGS: Segmentation: 5 lumbar type vertebral bodies.

Alignment: Normal

Vertebrae: No lumbar region fracture. Acute superior endplate
fracture T12 described in the thoracic study.

Paraspinal and other soft tissues: Normal

Disc levels: No abnormality at L2-3 or above.

L3-4: Mild facet osteoarthritis.  No stenosis.

L4-5: Mild facet osteoarthritis. No stenosis. Chronic healed pars
defects at L4.

L5-S1: Chronic disc degeneration with loss of disc height and small
endplate osteophytes. No compressive narrowing of the canal or
foramina.
IMPRESSION: No acute finding in the lumbar region. Old healed pars defects at
L4. Chronic disc degeneration at L5-S1 without stenosis.

## 2022-11-06 DIAGNOSIS — R7301 Impaired fasting glucose: Secondary | ICD-10-CM | POA: Diagnosis not present

## 2022-11-06 DIAGNOSIS — E663 Overweight: Secondary | ICD-10-CM | POA: Diagnosis not present

## 2022-11-06 DIAGNOSIS — E7800 Pure hypercholesterolemia, unspecified: Secondary | ICD-10-CM | POA: Diagnosis not present

## 2022-11-06 DIAGNOSIS — I10 Essential (primary) hypertension: Secondary | ICD-10-CM | POA: Diagnosis not present

## 2022-11-06 DIAGNOSIS — Z0000 Encounter for general adult medical examination without abnormal findings: Secondary | ICD-10-CM | POA: Diagnosis not present

## 2022-11-06 DIAGNOSIS — Z125 Encounter for screening for malignant neoplasm of prostate: Secondary | ICD-10-CM | POA: Diagnosis not present

## 2022-11-06 DIAGNOSIS — R5383 Other fatigue: Secondary | ICD-10-CM | POA: Diagnosis not present

## 2023-09-03 DIAGNOSIS — H5213 Myopia, bilateral: Secondary | ICD-10-CM | POA: Diagnosis not present

## 2023-10-26 DIAGNOSIS — H5203 Hypermetropia, bilateral: Secondary | ICD-10-CM | POA: Diagnosis not present

## 2023-11-16 DIAGNOSIS — E7800 Pure hypercholesterolemia, unspecified: Secondary | ICD-10-CM | POA: Diagnosis not present

## 2023-11-16 DIAGNOSIS — Z0000 Encounter for general adult medical examination without abnormal findings: Secondary | ICD-10-CM | POA: Diagnosis not present

## 2023-11-16 DIAGNOSIS — Z125 Encounter for screening for malignant neoplasm of prostate: Secondary | ICD-10-CM | POA: Diagnosis not present

## 2023-11-23 DIAGNOSIS — Z0000 Encounter for general adult medical examination without abnormal findings: Secondary | ICD-10-CM | POA: Diagnosis not present

## 2023-11-23 DIAGNOSIS — E7800 Pure hypercholesterolemia, unspecified: Secondary | ICD-10-CM | POA: Diagnosis not present

## 2023-11-23 DIAGNOSIS — I10 Essential (primary) hypertension: Secondary | ICD-10-CM | POA: Diagnosis not present

## 2023-11-30 ENCOUNTER — Ambulatory Visit: Admitting: Cardiology

## 2023-12-13 ENCOUNTER — Other Ambulatory Visit: Payer: Self-pay

## 2023-12-13 DIAGNOSIS — I10 Essential (primary) hypertension: Secondary | ICD-10-CM

## 2023-12-13 DIAGNOSIS — E7800 Pure hypercholesterolemia, unspecified: Secondary | ICD-10-CM

## 2023-12-13 HISTORY — DX: Essential (primary) hypertension: I10

## 2023-12-13 HISTORY — DX: Pure hypercholesterolemia, unspecified: E78.00

## 2023-12-14 ENCOUNTER — Encounter: Payer: Self-pay | Admitting: Cardiology

## 2023-12-14 ENCOUNTER — Ambulatory Visit: Attending: Cardiology | Admitting: Cardiology

## 2023-12-14 VITALS — BP 128/74 | HR 67 | Ht 67.0 in | Wt 203.4 lb

## 2023-12-14 DIAGNOSIS — R0609 Other forms of dyspnea: Secondary | ICD-10-CM | POA: Diagnosis not present

## 2023-12-14 DIAGNOSIS — Z8279 Family history of other congenital malformations, deformations and chromosomal abnormalities: Secondary | ICD-10-CM | POA: Diagnosis not present

## 2023-12-14 DIAGNOSIS — I701 Atherosclerosis of renal artery: Secondary | ICD-10-CM | POA: Insufficient documentation

## 2023-12-14 DIAGNOSIS — E7800 Pure hypercholesterolemia, unspecified: Secondary | ICD-10-CM | POA: Diagnosis not present

## 2023-12-14 DIAGNOSIS — I10 Essential (primary) hypertension: Secondary | ICD-10-CM | POA: Insufficient documentation

## 2023-12-14 NOTE — Patient Instructions (Signed)
 Medication Instructions:  Your physician recommends that you continue on your current medications as directed. Please refer to the Current Medication list given to you today.  *If you need a refill on your cardiac medications before your next appointment, please call your pharmacy*   Lab Work: None Ordered If you have labs (blood work) drawn today and your tests are completely normal, you will receive your results only by: MyChart Message (if you have MyChart) OR A paper copy in the mail If you have any lab test that is abnormal or we need to change your treatment, we will call you to review the results.   Testing/Procedures: Your physician has requested that you have an echocardiogram. Echocardiography is a painless test that uses sound waves to create images of your heart. It provides your doctor with information about the size and shape of your heart and how well your heart's chambers and valves are working. This procedure takes approximately one hour. There are no restrictions for this procedure. Please do NOT wear cologne, perfume, aftershave, or lotions (deodorant is allowed). Please arrive 15 minutes prior to your appointment time.  Please note: We ask at that you not bring children with you during ultrasound (echo/ vascular) testing. Due to room size and safety concerns, children are not allowed in the ultrasound rooms during exams. Our front office staff cannot provide observation of children in our lobby area while testing is being conducted. An adult accompanying a patient to their appointment will only be allowed in the ultrasound room at the discretion of the ultrasound technician under special circumstances. We apologize for any inconvenience.   Your physician has requested that you have a renal artery duplex. During this test, an ultrasound is used to evaluate blood flow to the kidneys. Allow one hour for this exam. Do not eat after midnight the day before and avoid carbonated  beverages. Take your medications as you usually do.;   Follow-Up: At [facility], you and your health needs are our priority.  As part of our continuing mission to provide you with exceptional heart care, we have created designated Provider Care Teams.  These Care Teams include your primary Cardiologist (physician) and Advanced Practice Providers (APPs -  Physician Assistants and Nurse Practitioners) who all work together to provide you with the care you need, when you need it.  We recommend signing up for the patient portal called MyChart.  Sign up information is provided on this After Visit Summary.  MyChart is used to connect with patients for Virtual Visits (Telemedicine).  Patients are able to view lab/test results, encounter notes, upcoming appointments, etc.  Non-urgent messages can be sent to your provider as well.   To learn more about what you can do with MyChart, go to [email or web address].    Your next appointment:   2 month(s)  The format for your next appointment:   In Person  Provider:   Lamar Fitch, MD    Other Instructions NA

## 2023-12-14 NOTE — Addendum Note (Signed)
 Addended by: ARLOA PLANAS D on: 12/14/2023 11:18 AM   Modules accepted: Orders

## 2023-12-14 NOTE — Progress Notes (Signed)
 Cardiology Consultation:    Date:  12/14/2023   ID:  ABDON PETROSKY, DOB 1975-11-11, MRN [id number]  PCP:  Verdia Lombard, MD  Cardiologist:  Lamar Fitch, MD   Referring MD: Corlis Pagan, NP   No chief complaint on file. I have high blood pressure  History of Present Illness:    Joshua Hines is a 48 y.o. male who is being seen today for the evaluation of hypertension at the request of Corlis Pagan, NP.  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  He was told to have high blood pressure that can give him a stroke.  Symptomatology colitis he is doing great he is asymptomatic can walk climb stairs with no difficulty denies having headache dizziness passing out.  Interesting part of his story is the fact that he got multiple family members with Marfan's he is moderate, he is brighter and 2 of his nephew as well have moderate from he has never been tested genetically for it but simply told him he does not have any physical future of Marfan's and that is why it was not done.  He does not smoke never did he is to drink some alcohol when he was in the [employer] but stopped after that.  He works not on any special diet he tries to be active.  No chest pain tightness squeezing pressure burning chest  Past Medical History:  Diagnosis Date   Hypertension, essential 12/13/2023   Pure hypercholesterolemia 12/13/2023    Past Surgical History:  Procedure Laterality Date   APPENDECTOMY  01/2015   PILONIDAL CYST / SINUS EXCISION      Current Medications: Current Meds   Medication Sig   amLODipine (NORVASC) 10 MG tablet Take 10 mg by mouth daily.   chlorthalidone (HYGROTON) 25 MG tablet Take 25 mg by mouth as needed (TAKE IF BP IS >140/90).   irbesartan (AVAPRO) 300 MG tablet Take 300 mg by mouth daily.   metoprolol succinate (TOPROL-XL) 100 MG 24 hr tablet Take 100 mg by mouth daily.     Allergies:   Patient has no known allergies.   Social History   Socioeconomic History   Marital status: Married    Spouse name: Not on file   Number of children: Not on file   Years of education: Not on file   Highest education level: Not on file  Occupational History   Not on file  Tobacco Use   Smoking status: Never   Smokeless tobacco: Never  Substance and Sexual Activity   Alcohol use: Not on file   Drug use: Not on file   Sexual activity: Not on file  Other Topics Concern   Not on file  Social History Narrative   Not on file   Social Drivers of Health   Financial Resource Strain: Not on file  Food Insecurity: Not on file  Transportation Needs: Not on file  Physical Activity: Not on file  Stress: Not on file  Social Connections: Not on file     Family History: The patient's family history includes  Heart attack in his father; Marfan syndrome in his brother and mother. ROS:   Please see the history of present illness.    All 14 point review of systems negative except as described per history of present illness.  EKGs/Labs/Other Studies Reviewed:    The following studies were reviewed today:   EKG:  EKG Interpretation Date/Time:  Friday December 14 2023 10:50:20 EDT Ventricular Rate:  67 PR Interval:  166 QRS Duration:  90 QT Interval:  390 QTC Calculation: 412 R Axis:   46  Text Interpretation: Normal sinus rhythm Minimal voltage criteria for LVH, may be normal variant Borderline ECG When compared with ECG of 19-Dec-2020 15:17, Vent. rate has decreased BY  43 BPM Confirmed by Bernie Charleston [phone]) on 12/14/2023 10:53:37 AM     Recent Labs: No results found for requested labs within last 365 days.  Recent Lipid Panel No results found for: CHOL, TRIG, HDL, CHOLHDL, VLDL, LDLCALC, LDLDIRECT  Physical Exam:    VS:  BP 128/74   Pulse 67   Ht 5' 7 (1.702 m)   Wt 203 lb 6.4 oz (92.3 kg)   SpO2 99%   BMI 31.86 kg/m     Wt Readings from Last 3 Encounters:  12/14/23 203 lb 6.4 oz (92.3 kg)     GEN:  Well nourished, well developed in no acute distress HEENT: Normal NECK: No JVD; No carotid bruits LYMPHATICS: No lymphadenopathy CARDIAC: RRR, no murmurs, no rubs, no gallops RESPIRATORY:  Clear to auscultation without rales, wheezing or rhonchi  ABDOMEN: Soft, non-tender, non-distended MUSCULOSKELETAL:  No edema; No deformity  SKIN: Warm and dry NEUROLOGIC:  Alert and oriented x 3 PSYCHIATRIC:  Normal affect   ASSESSMENT:    1. Hypertension, essential   2. Pure hypercholesterolemia   3. 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  Asking to get blood pressure monitor and check blood pressure on the regular basis and document this.  In terms of etiology of this phenomenon I will schedule him to have echocardiogram to assess left ventricle ejection fraction look for left ventricular hypertrophy, and other benefits of doing echocardiogram would be to look at the aorta since he does have multiple family members Marfan met that something we carefully need to look for.  He also will have renal duplex to look at the renal artery stenosis.  In the meantime continue present management. Dyslipidemia I did review his blood work and actually his cholesterol looks good his LDL 75 HDL 50 will continue present management. Multiple family members Oneil from.  Will start with echocardiogram but in the future anticipate need to do  genetic testing.   Medication Adjustments/Labs and Tests Ordered: Current medicines are reviewed at length with the patient today.  Concerns regarding medicines are outlined above.  Orders Placed This Encounter  Procedures   EKG 12-Lead   No orders of the defined types were placed in this encounter.   Signed, [place] DOROTHA Bernie, MD, [facility]. 12/14/2023 11:10 AM     [facility]

## 2024-01-09 ENCOUNTER — Ambulatory Visit: Attending: Cardiology

## 2024-01-09 ENCOUNTER — Ambulatory Visit (INDEPENDENT_AMBULATORY_CARE_PROVIDER_SITE_OTHER)

## 2024-01-09 DIAGNOSIS — I701 Atherosclerosis of renal artery: Secondary | ICD-10-CM | POA: Diagnosis not present

## 2024-01-09 DIAGNOSIS — R0609 Other forms of dyspnea: Secondary | ICD-10-CM | POA: Insufficient documentation

## 2024-01-09 LAB — ECHOCARDIOGRAM COMPLETE
AR max vel: 2.58 cm2
AV Area VTI: 2.68 cm2
AV Area mean vel: 2.45 cm2
AV Mean grad: 2.5 mmHg
AV Peak grad: 4.8 mmHg
Ao pk vel: 1.1 m/s
Area-P 1/2: 2.37 cm2
MV VTI: 1.64 cm2
S' Lateral: 3.5 cm

## 2024-01-16 ENCOUNTER — Ambulatory Visit: Payer: Self-pay | Admitting: Cardiology

## 2024-02-14 ENCOUNTER — Ambulatory Visit: Attending: Cardiology | Admitting: Cardiology
